# Patient Record
Sex: Male | Born: 2000 | Race: Black or African American | Hispanic: No | Marital: Single | Smoking: Never smoker
Health system: Southern US, Community
[De-identification: ages and names within clinical notes are randomized; demographics above are authoritative.]

## PROBLEM LIST (undated history)

## (undated) DIAGNOSIS — F909 Attention-deficit hyperactivity disorder, unspecified type: Secondary | ICD-10-CM

## (undated) DIAGNOSIS — Z9109 Other allergy status, other than to drugs and biological substances: Secondary | ICD-10-CM

## (undated) HISTORY — PX: CIRCUMCISION: SUR203

---
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
## 2018-04-30 IMAGING — DX DG CHEST 2V
2 series · 2 of 2 positions shown · non-contrast
Comparison: Chest radiograph performed 09/12/2016

CLINICAL DATA: Acute onset of shortness of breath, generalized
chest tightness, vomiting and headache. Initial encounter.

EXAM:
CHEST  2 VIEW

[chest pa]
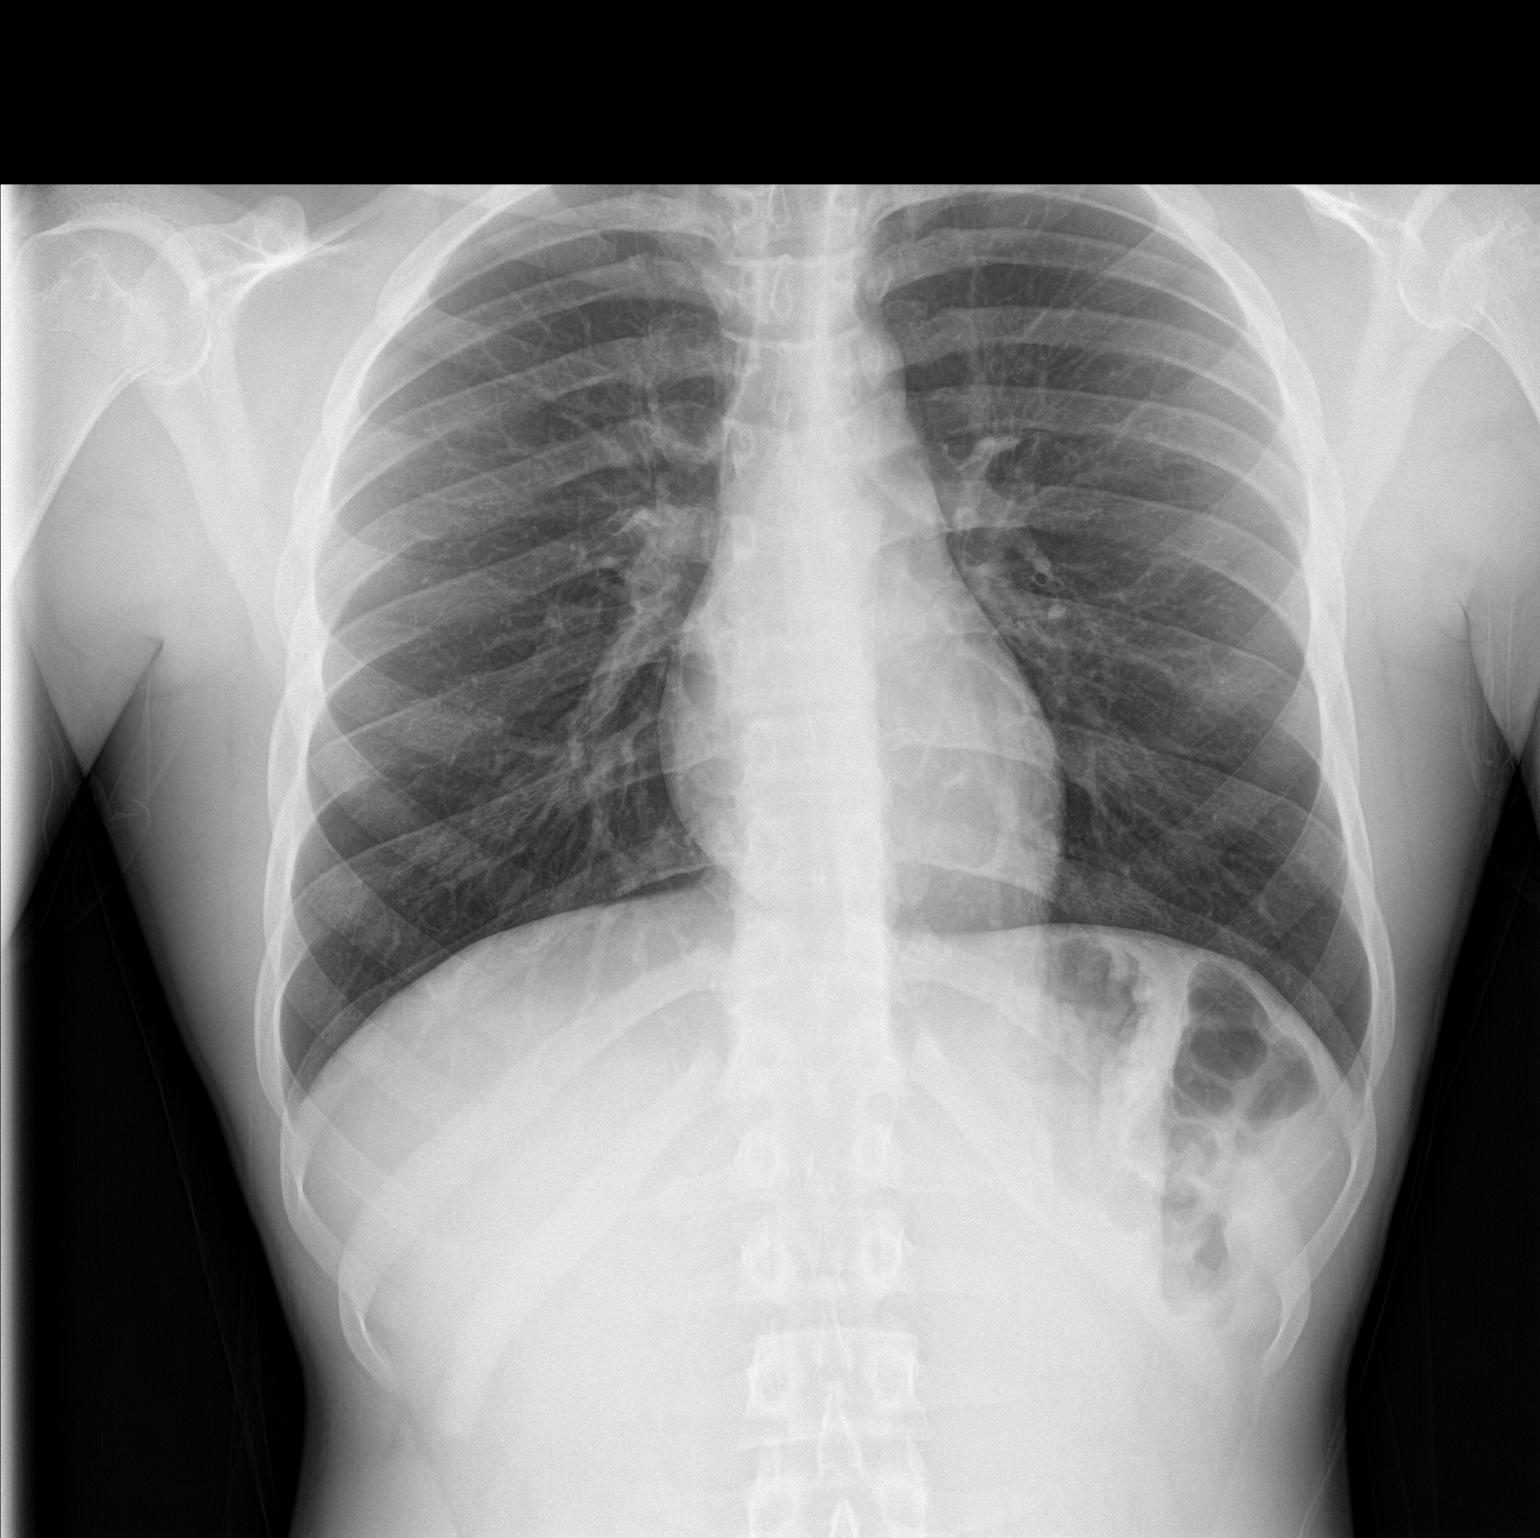

[chest lat]
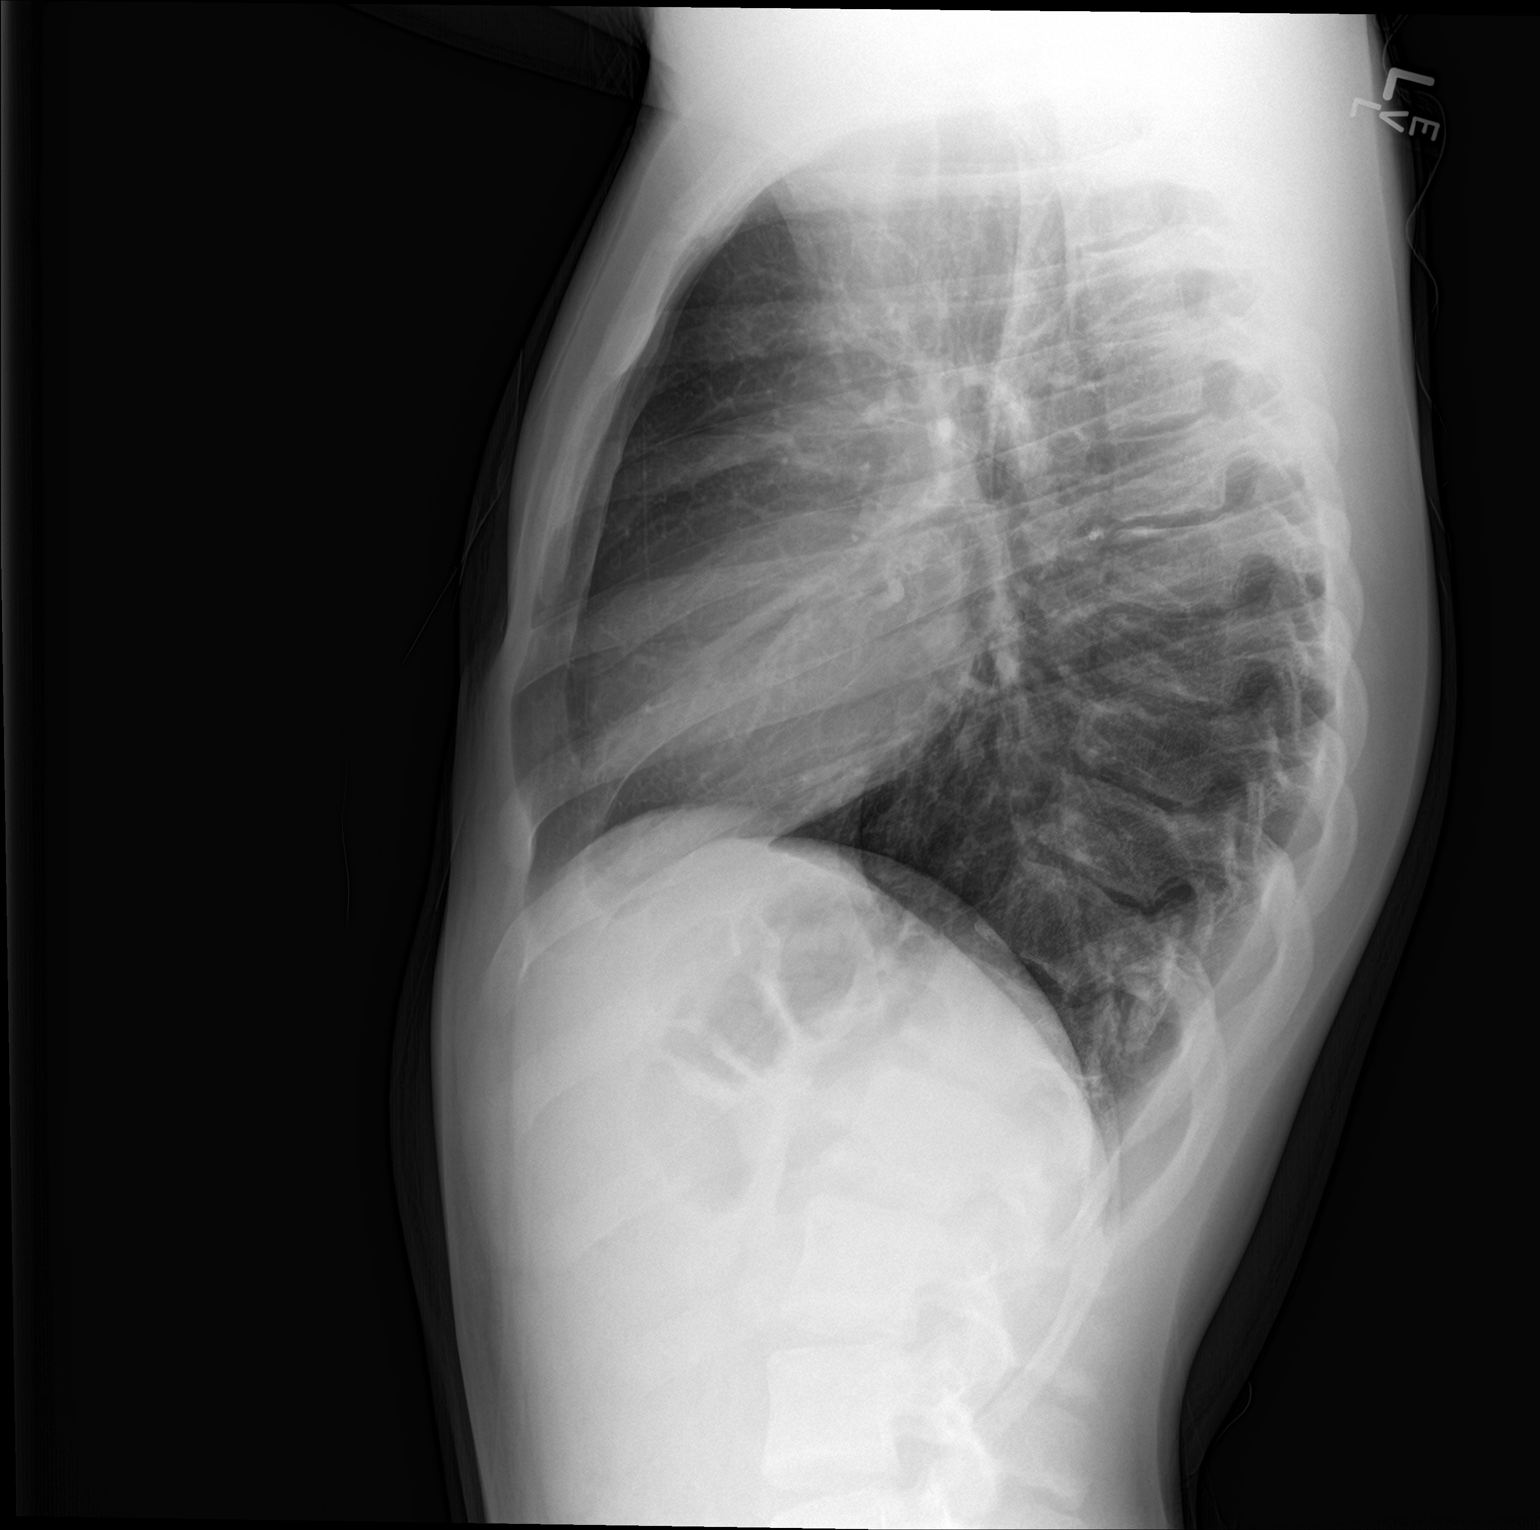

[2 of 2 positions shown; findings below may reference images not displayed]

FINDINGS: The lungs are well-aerated and clear. There is no evidence of focal
opacification, pleural effusion or pneumothorax.

The heart is normal in size; the mediastinal contour is within
normal limits. No acute osseous abnormalities are seen.
IMPRESSION: No acute cardiopulmonary process seen.

## 2020-10-08 ENCOUNTER — Emergency Department (HOSPITAL_BASED_OUTPATIENT_CLINIC_OR_DEPARTMENT_OTHER)
Admission: EM | Admit: 2020-10-08 | Discharge: 2020-10-08 | Disposition: A | Discharge status: Home / Self Care | Payer: Self-pay | Attending: Emergency Medicine | Admitting: Emergency Medicine

## 2020-10-08 ENCOUNTER — Encounter (HOSPITAL_BASED_OUTPATIENT_CLINIC_OR_DEPARTMENT_OTHER): Payer: Self-pay | Admitting: *Deleted

## 2020-10-08 ENCOUNTER — Other Ambulatory Visit: Payer: Self-pay

## 2020-10-08 ENCOUNTER — Emergency Department (HOSPITAL_COMMUNITY)
Admission: EM | Admit: 2020-10-08 | Discharge: 2020-10-08 | Disposition: A | Discharge status: Home / Self Care | Payer: Self-pay | Attending: Emergency Medicine | Admitting: Emergency Medicine

## 2020-10-08 DIAGNOSIS — Z5321 Procedure and treatment not carried out due to patient leaving prior to being seen by health care provider: Secondary | ICD-10-CM | POA: Insufficient documentation

## 2020-10-08 DIAGNOSIS — K0889 Other specified disorders of teeth and supporting structures: Secondary | ICD-10-CM | POA: Insufficient documentation

## 2020-10-08 DIAGNOSIS — Z7951 Long term (current) use of inhaled steroids: Secondary | ICD-10-CM | POA: Insufficient documentation

## 2020-10-08 DIAGNOSIS — J45909 Unspecified asthma, uncomplicated: Secondary | ICD-10-CM | POA: Insufficient documentation

## 2020-10-08 MED ORDER — PENICILLIN V POTASSIUM 500 MG PO TABS: 500.0000 mg | ORAL_TABLET | Freq: Four times a day (QID) | ORAL | 0 refills | Status: AC

## 2020-10-08 NOTE — ED Triage Notes (Signed)
Dental pain x 2 days

## 2020-10-08 NOTE — ED Triage Notes (Signed)
Pt reports L lower dental pain x 2 weeks. Taking OTC meds at home with relief.

## 2020-10-08 NOTE — ED Triage Notes (Signed)
Emergency Medicine Provider Triage Evaluation Note  Thomas Lang , a 20 y.o. male  was evaluated in triage.  Pt complains of dental pain. X 2 days. Not followed by dentistry. No facial swelling. Tolerating po intake  Review of Systems  Positive: dental pain Negative: facial swelling  Physical Exam  BP 137/80 (BP Location: Right Arm)   Pulse (!) 54   Temp 97.7 F (36.5 C) (Oral)   Resp 14   SpO2 100%  Gen:   Awake, no distress   HEENT:  Atraumatic, left lower dental pain Resp:  Normal effort  Cardiac:  Normal rate  Abd:   Nondistended, nontender  MSK:   Moves extremities without difficulty  Neuro:  Speech clear   Medical Decision Making  Medically screening exam initiated at 1:40 PM.  Appropriate orders placed.  Benjamen Peine was informed that the remainder of the evaluation will be completed by another provider, this initial triage assessment does not replace that evaluation, and the importance of remaining in the ED until their evaluation is complete.  Clinical Impression  Dental pain   Luchiano Viscomi A, PA-C 10/08/20 1342

## 2020-10-08 NOTE — Discharge Instructions (Signed)
I recommend a combination of tylenol and ibuprofen for management of your pain. You can take a low dose of both at the same time. I recommend 500 mg of Tylenol combined with 600 mg of ibuprofen. This is one maximum strength Tylenol and three regular ibuprofen. You can take these 2-3 times for day for your pain. Please try to take these medications with a small amount of food as well to prevent upsetting your stomach.  I would also recommend trying clove oil and Orajel.  You can apply these creams on the affected teeth where you are having pain.  Also consider salt water gargles 2-3 times a day.  I prescribed you a 10-day course of penicillin.  You can take this 4 times a day for the next 10 days.  Below is the contact information for Dr. Mia Creek, he is a Radiation protection practitioner.  Please give him a call and schedule a follow-up appointment.  If your symptoms worsen, please return to the emergency department.

## 2020-10-08 NOTE — ED Provider Notes (Signed)
[facility] EMERGENCY DEPARTMENT Provider Note   CSN: [id number] Arrival date & time: 10/08/20  2033     History Chief Complaint  Patient presents with  . Dental Pain    Thomas Lang is a 20 y.o. male.  HPI Patient is a 20 year old male with a history as noted below who presents the emergency department due to dental pain.  He states his pain is in the left lower portion of his mouth.  States it started about 2 weeks ago and began worsening about 2 days ago.  Worsens with chewing as well as when breathing cold air.  No fevers, chills, sore throat, facial swelling, shortness of breath.    Past Medical History:  Diagnosis Date  . ADHD (attention deficit hyperactivity disorder)   . Asthma   . Environmental allergies     There are no problems to display for this patient.   Past Surgical History:  Procedure Laterality Date  . CIRCUMCISION         No family history on file.  Social History   Tobacco Use  . Smoking status: Never Smoker  . Smokeless tobacco: Never Used  Substance Use Topics  . Alcohol use: No  . Drug use: Yes    Types: Marijuana    Home Medications Prior to Admission medications   Medication Sig Start Date End Date Taking? Authorizing Provider  penicillin v potassium (VEETID) 500 MG tablet Take 1 tablet (500 mg total) by mouth 4 (four) times daily for 10 days. 10/08/20 10/18/20 Yes Placido Sou, PA-C  albuterol (PROVENTIL) (2.5 MG/3ML) 0.083% nebulizer solution Take 2.5 mg by nebulization 6 (six) times daily. As needed for shortness of breath and wheezing     [provider]  albuterol (PROVENTIL,VENTOLIN) 90 MCG/ACT inhaler Inhale 2 puffs into the lungs every 6 (six) hours as needed. Shortness of breath and wheezing     [provider]  beclomethasone (QVAR) 80 MCG/ACT inhaler Inhale 2 puffs into the lungs 2 (two) times daily.      [provider]  lisdexamfetamine (VYVANSE) 70 MG capsule Take 70 mg by mouth  every morning.      [provider]  loperamide (IMODIUM) 2 MG capsule Take 1 capsule (2 mg total) by mouth 4 (four) times daily as needed for diarrhea or loose stools. 02/09/17   Everlene Farrier, PA-C  ondansetron (ZOFRAN ODT) 4 MG disintegrating tablet Take 1 tablet (4 mg total) by mouth every 8 (eight) hours as needed for nausea or vomiting. 02/09/17   Everlene Farrier, PA-C    Allergies    Fish allergy  Review of Systems   Review of Systems  Constitutional: Negative for chills and fever.  HENT: Positive for dental problem. Negative for sore throat.   Respiratory: Negative for shortness of breath.     Physical Exam Updated Vital Signs BP 122/62   Pulse (!) 57   Temp 98.5 F (36.9 C) (Oral)   Resp 16   Ht 5\' 11"  (1.803 m)   Wt 74.8 kg   SpO2 100%   BMI 23.01 kg/m   Physical Exam Vitals and nursing note reviewed.  Constitutional:      General: He is not in acute distress.    Appearance: He is well-developed.  HENT:     Head: Normocephalic and atraumatic.     Right Ear: External ear normal.     Left Ear: External ear normal.     Mouth/Throat:     Comments: Broken left lower  first molar.  Mild pain with manipulation of this tooth.  Additional mild pain noted overlying the left lower posterior gum where it appears that a wisdom tooth has nearly erupted.  No visible erythema.  No fluctuance.  Uvula midline.  Readily handling secretions.  No hot potato voice.  Soft submental and sublingual compartments. Eyes:     General: No scleral icterus.       Right eye: No discharge.        Left eye: No discharge.     Conjunctiva/sclera: Conjunctivae normal.  Neck:     Trachea: No tracheal deviation.  Cardiovascular:     Rate and Rhythm: Normal rate.  Pulmonary:     Effort: Pulmonary effort is normal. No respiratory distress.     Breath sounds: No stridor.  Abdominal:     General: There is no distension.  Musculoskeletal:        General: No swelling or deformity.      Cervical back: Neck supple.  Skin:    General: Skin is warm and dry.     Findings: No rash.  Neurological:     Mental Status: He is alert.     Cranial Nerves: Cranial nerve deficit: no gross deficits.    ED Results / Procedures / Treatments   Labs (all labs ordered are listed, but only abnormal results are displayed) Labs Reviewed - No data to display  EKG None  Radiology No results found.  Procedures Procedures   Medications Ordered in ED Medications - No data to display  ED Course  I have reviewed the triage vital signs and the nursing notes.  Pertinent labs & imaging results that were available during my care of the patient were reviewed by me and considered in my medical decision making (see chart for details).    MDM Rules/Calculators/A&P                          Patient is a 20 year old male who presents the emergency department due to dental pain.  He has been taking OTC pain meds with minimal relief.  He has both a broken tooth as well as wisdom teeth that are attempting to break through the gums.  No obvious signs of infection on my exam.  Will discharge on a course of penicillin for possible developing infection in the left lower wisdom tooth.  Discussed Tylenol and ibuprofen for management of his pain.  Discussed multiple OTC topical creams for management of his pain as well.  Patient given a referral to a local dentist.  Feel that he is stable for discharge and he is agreeable.  His questions were answered and he was amicable at the time of discharge.  Final Clinical Impression(s) / ED Diagnoses Final diagnoses:  Pain, dental   Rx / DC Orders ED Discharge Orders         Ordered    penicillin v potassium (VEETID) 500 MG tablet  4 times daily        10/08/20 2139           Placido Sou, PA-C 10/08/20 2347    Terald Sleeper, MD 10/09/20 [phone]

## 2022-04-30 ENCOUNTER — Ambulatory Visit (HOSPITAL_COMMUNITY)
Admission: EM | Admit: 2022-04-30 | Discharge: 2022-04-30 | Disposition: A | Discharge status: Home / Self Care | Payer: Self-pay | Attending: Family Medicine | Admitting: Family Medicine

## 2022-04-30 ENCOUNTER — Other Ambulatory Visit: Payer: Self-pay

## 2022-04-30 ENCOUNTER — Encounter (HOSPITAL_COMMUNITY): Payer: Self-pay | Admitting: Emergency Medicine

## 2022-04-30 DIAGNOSIS — R319 Hematuria, unspecified: Secondary | ICD-10-CM | POA: Insufficient documentation

## 2022-04-30 DIAGNOSIS — N3091 Cystitis, unspecified with hematuria: Secondary | ICD-10-CM

## 2022-04-30 DIAGNOSIS — N3090 Cystitis, unspecified without hematuria: Secondary | ICD-10-CM | POA: Insufficient documentation

## 2022-04-30 LAB — POCT URINALYSIS DIPSTICK, ED / UC
Bilirubin Urine: NEGATIVE
Glucose, UA: NEGATIVE mg/dL
Ketones, ur: NEGATIVE mg/dL
Nitrite: NEGATIVE
Protein, ur: NEGATIVE mg/dL
Specific Gravity, Urine: 1.025 (ref 1.005–1.030)
Urobilinogen, UA: 1 mg/dL (ref 0.0–1.0)
pH: 6.5 (ref 5.0–8.0)

## 2022-04-30 MED ORDER — CEPHALEXIN 500 MG PO CAPS: 500.0000 mg | ORAL_CAPSULE | Freq: Two times a day (BID) | ORAL | 0 refills | Status: DC

## 2022-04-30 NOTE — ED Provider Notes (Signed)
  [name]    ASSESSMENT & PLAN:  1. Hematuria, unspecified type   2. Cystitis    Declines STI screening. Begin: Meds ordered this encounter  Medications   cephALEXin (KEFLEX) 500 MG capsule    Sig: Take 1 capsule (500 mg total) by mouth 2 (two) times daily.    Dispense:  14 capsule    Refill:  0   No signs of pyelonephritis. Urine culture sent. Will notify of any significant results. Will follow up with his PCP or here if not showing improvement over the next 48 hours, sooner if needed.  Outlined signs and symptoms indicating need for more acute intervention. Patient verbalized understanding. After Visit Summary given.  SUBJECTIVE:  Thomas Lang is a 21 y.o. male who complains of gross hematuria; noted two d ago; intermittent. No back or abdominal pain.  Denies pain with urination.  Denies penile discharge Afebrile. Normal PO intake without n/v/d.  OBJECTIVE:  Vitals:   04/30/22 1022  BP: 105/64  Pulse: 84  Resp: 18  Temp: 98.5 F (36.9 C)  TempSrc: Oral  SpO2: 98%   General appearance: alert; no distress Abdomen: soft, NT Back: no CVA tenderness Extremities: no edema; symmetrical with no gross deformities Skin: warm and dry Neurologic: normal gait Psychological: alert and cooperative; normal mood and affect  Labs Reviewed  POCT URINALYSIS DIPSTICK, ED / UC - Abnormal; Notable for the following components:      Result Value   Hgb urine dipstick TRACE (*)    Leukocytes,Ua TRACE (*)    All other components within normal limits  URINE CULTURE    Allergies  Allergen Reactions   Fish Allergy Hives    Past Medical History:  Diagnosis Date   ADHD (attention deficit hyperactivity disorder)    Asthma    Environmental allergies    Social History   Socioeconomic History   Marital status: Single    Spouse name: Not on file   Number of children: Not on file   Years of education: Not on file   Highest education level: Not on file   Occupational History   Not on file  Tobacco Use   Smoking status: Never   Smokeless tobacco: Never  Vaping Use   Vaping Use: Never used  Substance and Sexual Activity   Alcohol use: No   Drug use: Yes    Types: Marijuana   Sexual activity: Not on file  Other Topics Concern   Not on file  Social History Narrative   Not on file   Social Determinants of Health   Financial Resource Strain: Not on file  Food Insecurity: Not on file  Transportation Needs: Not on file  Physical Activity: Not on file  Stress: Not on file  Social Connections: Not on file  Intimate Partner Violence: Not on file   History reviewed. No pertinent family history.      Vanessa Kick, MD 04/30/22 [phone]

## 2022-04-30 NOTE — ED Triage Notes (Signed)
Noticed 2 days ago, blood in urine.  Incidents have been intermittent.  Most recent episode was this morning.  No new back or abdominal pain.  Denies pain with urination.  Denies penile discharge

## 2022-05-01 LAB — URINE CULTURE: Culture: NO GROWTH

## 2022-06-24 ENCOUNTER — Ambulatory Visit (HOSPITAL_COMMUNITY)
Admission: EM | Admit: 2022-06-24 | Discharge: 2022-06-24 | Disposition: A | Discharge status: Home / Self Care | Payer: Self-pay | Attending: Physician Assistant | Admitting: Physician Assistant

## 2022-06-24 ENCOUNTER — Encounter (HOSPITAL_COMMUNITY): Payer: Self-pay | Admitting: Emergency Medicine

## 2022-06-24 ENCOUNTER — Other Ambulatory Visit: Payer: Self-pay

## 2022-06-24 DIAGNOSIS — J069 Acute upper respiratory infection, unspecified: Secondary | ICD-10-CM | POA: Insufficient documentation

## 2022-06-24 DIAGNOSIS — R52 Pain, unspecified: Secondary | ICD-10-CM | POA: Insufficient documentation

## 2022-06-24 DIAGNOSIS — Z1152 Encounter for screening for COVID-19: Secondary | ICD-10-CM | POA: Insufficient documentation

## 2022-06-24 DIAGNOSIS — J4521 Mild intermittent asthma with (acute) exacerbation: Secondary | ICD-10-CM | POA: Insufficient documentation

## 2022-06-24 LAB — POC INFLUENZA A AND B ANTIGEN (URGENT CARE ONLY)
INFLUENZA A ANTIGEN, POC: NEGATIVE
INFLUENZA B ANTIGEN, POC: NEGATIVE

## 2022-06-24 MED ORDER — IBUPROFEN 800 MG PO TABS
800.0000 mg | ORAL_TABLET | Freq: Once | ORAL | Status: AC
  Administered 2022-06-24: 800 mg via ORAL

## 2022-06-24 MED ORDER — ALBUTEROL SULFATE HFA 108 (90 BASE) MCG/ACT IN AERS
INHALATION_SPRAY | RESPIRATORY_TRACT | Status: AC
  Filled 2022-06-24: qty 6.7

## 2022-06-24 MED ORDER — PROMETHAZINE-DM 6.25-15 MG/5ML PO SYRP: 5.0000 mL | ORAL_SOLUTION | Freq: Two times a day (BID) | ORAL | 0 refills | Status: AC | PRN

## 2022-06-24 MED ORDER — ALBUTEROL SULFATE HFA 108 (90 BASE) MCG/ACT IN AERS
2.0000 | INHALATION_SPRAY | Freq: Once | RESPIRATORY_TRACT | Status: AC
  Administered 2022-06-24: 2 via RESPIRATORY_TRACT

## 2022-06-24 MED ORDER — IBUPROFEN 800 MG PO TABS
ORAL_TABLET | ORAL | Status: AC
  Filled 2022-06-24: qty 1

## 2022-06-24 MED ORDER — BUDESONIDE-FORMOTEROL FUMARATE 80-4.5 MCG/ACT IN AERO: 2.0000 | INHALATION_SPRAY | Freq: Two times a day (BID) | RESPIRATORY_TRACT | 2 refills | Status: AC

## 2022-06-24 NOTE — ED Triage Notes (Signed)
Complains of symptoms starting last night, muscle aches, chills, cough, stuffy nose.  Patient is crying in treatment room.  Patient has not tried any medications

## 2022-06-24 NOTE — ED Notes (Signed)
Patient requested his mother Dixon Boos  [phone] called and told to tell his wife that he is here at [facility] and did not make the car appt since waiting here to be seen, phone is not charged

## 2022-06-24 NOTE — Discharge Instructions (Signed)
We will contact you if you are positive for COVID.  Please stay home and rest and drink plenty of fluid.  We gave you ibuprofen today so do not take additional ibuprofen for 8 hours.  You can use Tylenol for additional pain relief in the meantime.  After the 8 hours I recommend you alternate Tylenol ibuprofen regularly to manage her body aches.  Use your albuterol inhaler every 4-6 hours as needed for shortness of breath.  I have called in Symbicort.  This should help manage your asthma.  Use this twice a day and make sure to rinse your mouth following use of medication to prevent thrush.  Use Promethazine DM for cough.  This will make you sleepy so do not drive or drink alcohol while taking it.  If your symptoms are not improving within a few days or if at any point anything worsens and you have high fever not responding medication, shortness of breath despite medication, weakness, nausea/vomiting, chest pain you need to be seen immediately.

## 2022-06-24 NOTE — ED Provider Notes (Signed)
[facility]    CSN: [id number] Arrival date & time: 06/24/22  3734      History   Chief Complaint Chief Complaint  Patient presents with   Chills    HPI Thomas Lang is a 21 y.o. male.   Patient presents today with a 24-hour history of URI symptoms including fever, cough, congestion, body aches.  Denies any headache, chest pain, nausea, vomiting, diarrhea.  Does report some shortness of breath but attributes this to his asthma.  He does have a history of asthma but is not currently taking any medication and is out of his inhaler.  He has been hospitalized in the past for asthma many years ago but has not required intubation.  Denies any recent antibiotics or steroids.  He denies history of smoking.  He has not had COVID or influenza vaccine.  Denies any known sick contacts.  Reports he is feeling poorly and the body aches are very intense.    Past Medical History:  Diagnosis Date   ADHD (attention deficit hyperactivity disorder)    Asthma    Environmental allergies     There are no problems to display for this patient.   Past Surgical History:  Procedure Laterality Date   CIRCUMCISION         Home Medications    Prior to Admission medications   Medication Sig Start Date End Date Taking? Authorizing Provider  budesonide-formoterol (SYMBICORT) 80-4.5 MCG/ACT inhaler Inhale 2 puffs into the lungs in the morning and at bedtime. 06/24/22  Yes Brayam Boeke K, PA-C  promethazine-dextromethorphan (PROMETHAZINE-DM) 6.25-15 MG/5ML syrup Take 5 mLs by mouth 2 (two) times daily as needed for cough. 06/24/22  Yes Ociel Retherford K, PA-C  albuterol (PROVENTIL) (2.5 MG/3ML) 0.083% nebulizer solution Take 2.5 mg by nebulization 6 (six) times daily. As needed for shortness of breath and wheezing  Patient not taking: Reported on 04/30/2022    [provider]  albuterol (PROVENTIL,VENTOLIN) 90 MCG/ACT inhaler Inhale 2 puffs into the lungs every 6 (six) hours as needed.  Shortness of breath and wheezing  Patient not taking: Reported on 04/30/2022    [provider]    Family History History reviewed. No pertinent family history.  Social History Social History   Tobacco Use   Smoking status: Never   Smokeless tobacco: Never  Vaping Use   Vaping Use: Never used  Substance Use Topics   Alcohol use: No   Drug use: Not Currently    Types: Marijuana     Allergies   Fish allergy   Review of Systems Review of Systems  Constitutional:  Positive for activity change, fatigue and fever. Negative for appetite change.  HENT:  Positive for congestion. Negative for sinus pressure, sneezing and sore throat.   Respiratory:  Positive for cough. Negative for shortness of breath.   Cardiovascular:  Negative for chest pain.  Gastrointestinal:  Negative for abdominal pain, diarrhea, nausea and vomiting.  Musculoskeletal:  Positive for arthralgias and myalgias.  Neurological:  Negative for dizziness, light-headedness and headaches.     Physical Exam Triage Vital Signs ED Triage Vitals  Enc Vitals Group     BP 06/24/22 1030 110/64     Pulse Rate 06/24/22 1030 88     Resp 06/24/22 1030 20     Temp 06/24/22 1030 99.1 F (37.3 C)     Temp Source 06/24/22 1030 Oral     SpO2 06/24/22 1030 100 %     Weight --  Height --      Head Circumference --      Peak Flow --      Pain Score 06/24/22 1028 8     Pain Loc --      Pain Edu? --      Excl. in GC? --    No data found.  Updated Vital Signs BP 110/64 (BP Location: Left Arm)   Pulse 88   Temp 99.1 F (37.3 C) (Oral)   Resp 20   SpO2 100%   Visual Acuity Right Eye Distance:   Left Eye Distance:   Bilateral Distance:    Right Eye Near:   Left Eye Near:    Bilateral Near:     Physical Exam Vitals reviewed.  Constitutional:      General: He is awake.     Appearance: Normal appearance. He is well-developed. He is ill-appearing.     Comments: Very pleasant male appears stated age  in no acute distress laying on exam room table obviously uncomfortable  HENT:     Head: Normocephalic and atraumatic.     Right Ear: Tympanic membrane, ear canal and external ear normal. Tympanic membrane is not erythematous or bulging.     Left Ear: Tympanic membrane, ear canal and external ear normal. Tympanic membrane is not erythematous or bulging.     Nose: Nose normal.     Mouth/Throat:     Pharynx: Uvula midline. No oropharyngeal exudate or posterior oropharyngeal erythema.  Cardiovascular:     Rate and Rhythm: Normal rate and regular rhythm.     Heart sounds: Normal heart sounds, S1 normal and S2 normal. No murmur heard. Pulmonary:     Effort: Pulmonary effort is normal. No accessory muscle usage or respiratory distress.     Breath sounds: No stridor. Wheezing present. No rhonchi or rales.     Comments: Scattered wheezing Neurological:     Mental Status: He is alert.  Psychiatric:        Behavior: Behavior is cooperative.      UC Treatments / Results  Labs (all labs ordered are listed, but only abnormal results are displayed) Labs Reviewed  SARS CORONAVIRUS 2 (TAT 6-24 HRS)  POC INFLUENZA A AND B ANTIGEN (URGENT CARE ONLY)    EKG   Radiology No results found.  Procedures Procedures (including critical care time)  Medications Ordered in UC Medications  albuterol (VENTOLIN HFA) 108 (90 Base) MCG/ACT inhaler 2 puff (2 puffs Inhalation Given 06/24/22 1112)  ibuprofen (ADVIL) tablet 800 mg (800 mg Oral Given 06/24/22 1112)    Initial Impression / Assessment and Plan / UC Course  I have reviewed the triage vital signs and the nursing notes.  Pertinent labs & imaging results that were available during my care of the patient were reviewed by me and considered in my medical decision making (see chart for details).     Patient is well-appearing, afebrile, nontoxic, nontachycardic.  Suspect viral etiology given clinical presentation.  Flu testing was negative in  clinic today.  COVID testing is pending.  He technically qualifies for antivirals given his history of asthma and if positive for COVID but seeing as he is otherwise very healthy suspect he would benefit minimally from these medications and so we will defer initiation.  He was given ibuprofen 800 mg with significant improvement of symptoms.  Discussed that he is to avoid NSAIDs for additional 8 hours and after that can alternate Tylenol and ibuprofen for body aches.  He was given  2 puffs of albuterol inhaler in clinic with significant improvement of symptoms.  He was sent home with this medication with instruction to use it every 4-6 hours as needed for shortness of breath and coughing fits.  Will start Symbicort to help manage his ongoing asthma symptoms.  Discussed that he should rinse his mouth following use of this medication to prevent thrush.  Recommended over-the-counter medication for symptom management.  He was prescribed Promethazine DM for cough.  Discussed this can be sedating and he is not to drive or drink alcohol while taking it.  If his symptoms are not improving within a week he is to return for reevaluation.  If anything worsens and he has ongoing shortness of breath despite medication, chest pain, nausea/vomiting interfere with oral intake, weakness he needs to be seen immediately.  Strict return precautions given.  Work excuse note with current CDC return to work guidelines based on COVID test result provided.  Final Clinical Impressions(s) / UC Diagnoses   Final diagnoses:  Upper respiratory tract infection, unspecified type  Body aches  Mild intermittent asthma with acute exacerbation     Discharge Instructions      We will contact you if you are positive for COVID.  Please stay home and rest and drink plenty of fluid.  We gave you ibuprofen today so do not take additional ibuprofen for 8 hours.  You can use Tylenol for additional pain relief in the meantime.  After the 8 hours I  recommend you alternate Tylenol ibuprofen regularly to manage her body aches.  Use your albuterol inhaler every 4-6 hours as needed for shortness of breath.  I have called in Symbicort.  This should help manage your asthma.  Use this twice a day and make sure to rinse your mouth following use of medication to prevent thrush.  Use Promethazine DM for cough.  This will make you sleepy so do not drive or drink alcohol while taking it.  If your symptoms are not improving within a few days or if at any point anything worsens and you have high fever not responding medication, shortness of breath despite medication, weakness, nausea/vomiting, chest pain you need to be seen immediately.     ED Prescriptions     Medication Sig Dispense Auth. Provider   budesonide-formoterol (SYMBICORT) 80-4.5 MCG/ACT inhaler Inhale 2 puffs into the lungs in the morning and at bedtime. 1 each Haliegh Khurana K, PA-C   promethazine-dextromethorphan (PROMETHAZINE-DM) 6.25-15 MG/5ML syrup Take 5 mLs by mouth 2 (two) times daily as needed for cough. 118 mL Kahley Leib K, PA-C      PDMP not reviewed this encounter.   Jeani Hawking, PA-C 06/24/22 1143

## 2022-06-25 LAB — SARS CORONAVIRUS 2 (TAT 6-24 HRS): SARS Coronavirus 2: NEGATIVE
